# Patient Record
Sex: Female | Born: 1960 | Smoking: Never smoker
Health system: Southern US, Community
[De-identification: ages and names within clinical notes are randomized; demographics above are authoritative.]

---

## 2012-04-08 ENCOUNTER — Ambulatory Visit: Payer: Self-pay

## 2019-07-15 ENCOUNTER — Emergency Department
Admission: EM | Admit: 2019-07-15 | Discharge: 2019-07-15 | Disposition: A | Discharge status: Home / Self Care | Payer: No Typology Code available for payment source | Attending: Emergency Medicine | Admitting: Emergency Medicine

## 2019-07-15 ENCOUNTER — Emergency Department: Payer: No Typology Code available for payment source

## 2019-07-15 ENCOUNTER — Encounter: Payer: Self-pay | Admitting: Emergency Medicine

## 2019-07-15 ENCOUNTER — Other Ambulatory Visit: Payer: Self-pay

## 2019-07-15 DIAGNOSIS — Y939 Activity, unspecified: Secondary | ICD-10-CM | POA: Diagnosis not present

## 2019-07-15 DIAGNOSIS — S199XXA Unspecified injury of neck, initial encounter: Secondary | ICD-10-CM | POA: Diagnosis not present

## 2019-07-15 DIAGNOSIS — S0990XA Unspecified injury of head, initial encounter: Secondary | ICD-10-CM | POA: Diagnosis present

## 2019-07-15 DIAGNOSIS — Y92410 Unspecified street and highway as the place of occurrence of the external cause: Secondary | ICD-10-CM | POA: Diagnosis not present

## 2019-07-15 DIAGNOSIS — R079 Chest pain, unspecified: Secondary | ICD-10-CM | POA: Diagnosis not present

## 2019-07-15 DIAGNOSIS — Y999 Unspecified external cause status: Secondary | ICD-10-CM | POA: Diagnosis not present

## 2019-07-15 MED ORDER — BACLOFEN 5 MG PO TABS: 5.0000 mg | ORAL_TABLET | Freq: Three times a day (TID) | ORAL | 0 refills | Status: DC | PRN

## 2019-07-15 MED ORDER — ACETAMINOPHEN 500 MG PO TABS: 500.0000 mg | ORAL_TABLET | Freq: Four times a day (QID) | ORAL | 0 refills | Status: DC | PRN
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
## 2019-07-27 ENCOUNTER — Emergency Department
Admission: EM | Admit: 2019-07-27 | Discharge: 2019-07-27 | Disposition: A | Discharge status: Home / Self Care | Payer: Self-pay | Attending: Emergency Medicine | Admitting: Emergency Medicine

## 2019-07-27 ENCOUNTER — Encounter: Payer: Self-pay | Admitting: *Deleted

## 2019-07-27 ENCOUNTER — Other Ambulatory Visit: Payer: Self-pay

## 2019-07-27 DIAGNOSIS — R0602 Shortness of breath: Secondary | ICD-10-CM | POA: Insufficient documentation

## 2019-07-27 DIAGNOSIS — R0789 Other chest pain: Secondary | ICD-10-CM | POA: Insufficient documentation

## 2019-07-27 DIAGNOSIS — Z760 Encounter for issue of repeat prescription: Secondary | ICD-10-CM | POA: Insufficient documentation

## 2019-07-27 DIAGNOSIS — S0083XD Contusion of other part of head, subsequent encounter: Secondary | ICD-10-CM | POA: Insufficient documentation

## 2019-07-27 MED ORDER — ACETAMINOPHEN 500 MG PO TABS: 500.0000 mg | ORAL_TABLET | Freq: Four times a day (QID) | ORAL | 0 refills | Status: AC | PRN

## 2019-07-27 MED ORDER — BACLOFEN 5 MG PO TABS: 5.0000 mg | ORAL_TABLET | Freq: Three times a day (TID) | ORAL | 0 refills | Status: AC | PRN

## 2019-07-27 MED ORDER — SODIUM CHLORIDE 0.9% FLUSH: 3.0000 mL | Freq: Once | INTRAVENOUS | Status: DC
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
## 2019-07-27 NOTE — Discharge Instructions (Signed)
Take the medicines as directed.
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
## 2019-07-27 NOTE — ED Notes (Signed)
Pt provided interpretor on a stick for DC instructions by this nurse
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
